# Patient Record
Sex: Male | Born: 1990 | Race: White | Hispanic: Yes | Marital: Married | Smoking: Former smoker
Health system: Southern US, Community
[De-identification: ages and names within clinical notes are randomized; demographics above are authoritative.]

## PROBLEM LIST (undated history)

## (undated) DIAGNOSIS — J45909 Unspecified asthma, uncomplicated: Secondary | ICD-10-CM

## (undated) HISTORY — DX: Unspecified asthma, uncomplicated: J45.909

## (undated) HISTORY — PX: SHOULDER SURGERY: SHX246

---
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
## 2014-09-17 ENCOUNTER — Ambulatory Visit: Payer: Medicaid Other

## 2014-11-13 ENCOUNTER — Ambulatory Visit (INDEPENDENT_AMBULATORY_CARE_PROVIDER_SITE_OTHER): Payer: Self-pay | Admitting: Internal Medicine

## 2014-11-13 VITALS — BP 112/68 | HR 58 | Temp 98.5°F | Resp 12 | Ht 67.5 in | Wt 149.5 lb

## 2014-11-13 DIAGNOSIS — Z202 Contact with and (suspected) exposure to infections with a predominantly sexual mode of transmission: Secondary | ICD-10-CM

## 2014-11-13 LAB — POCT UA - MICROSCOPIC ONLY
AMORPHOUS: POSITIVE
BACTERIA, U MICROSCOPIC: NEGATIVE
CASTS, UR, LPF, POC: NEGATIVE
Crystals, Ur, HPF, POC: NEGATIVE
Epithelial cells, urine per micros: NEGATIVE
Mucus, UA: NEGATIVE
RBC, URINE, MICROSCOPIC: NEGATIVE
Yeast, UA: NEGATIVE

## 2014-11-13 LAB — POCT URINALYSIS DIPSTICK
Bilirubin, UA: NEGATIVE
GLUCOSE UA: NEGATIVE
KETONES UA: NEGATIVE
LEUKOCYTES UA: NEGATIVE
Nitrite, UA: NEGATIVE
PH UA: 8
Protein, UA: NEGATIVE
RBC UA: NEGATIVE
SPEC GRAV UA: 1.02
UROBILINOGEN UA: 1

## 2014-11-13 MED ORDER — METRONIDAZOLE 500 MG PO TABS: 500.0000 mg | ORAL_TABLET | Freq: Two times a day (BID) | ORAL | Status: DC

## 2014-11-13 NOTE — Progress Notes (Signed)
   Subjective:    Patient ID: Steven EmeryJose Williamson, male    DOB: 03/01/1991, 24 y.o.   MRN: [id number]  HPI Scribed by Remi HaggardJulie Greer LPN with Dr. Perrin MalteseGuest. Room 2314  24 year old male complains of odor(fishy) from penis for one year since wife had iud in place Usually notice after sex with girlfriend and masturbation No discharge or rash. She has odor also, not all the time.    Review of Systems     Objective:   Physical Exam  Constitutional: He is oriented to person, place, and time. He appears well-developed and well-nourished. No distress.  HENT:  Head: Normocephalic.  Right Ear: External ear normal.  Left Ear: External ear normal.  Eyes: Conjunctivae and EOM are normal. Pupils are equal, round, and reactive to light.  Neck: Normal range of motion. Neck supple.  Cardiovascular: Normal rate and normal heart sounds.   Abdominal: Hernia confirmed negative in the right inguinal area and confirmed negative in the left inguinal area.  Genitourinary: Rectum normal, prostate normal, testes normal and penis normal. Prostate is not enlarged and not tender. Uncircumcised. No penile erythema or penile tenderness. No discharge found.  Normal exam  Musculoskeletal: Normal range of motion.  Lymphadenopathy:       Right: No inguinal adenopathy present.       Left: No inguinal adenopathy present.  Neurological: He is alert and oriented to person, place, and time.  Skin: No rash noted.  Psychiatric: He has a normal mood and affect. His behavior is normal. Thought content normal.          Assessment & Plan:  ODOR

## 2014-11-13 NOTE — Patient Instructions (Signed)
Trichomoniasis Trichomoniasis is an infection caused by an organism called Trichomonas. The infection can affect both women and men. In women, the outer male genitalia and the vagina are affected. In men, the penis is mainly affected, but the prostate and other reproductive organs can also be involved. Trichomoniasis is a sexually transmitted infection (STI) and is most often passed to another person through sexual contact.  RISK FACTORS  Having unprotected sexual intercourse.  Having sexual intercourse with an infected partner. SIGNS AND SYMPTOMS  Symptoms of trichomoniasis in women include:  Abnormal gray-green frothy vaginal discharge.  Itching and irritation of the vagina.  Itching and irritation of the area outside the vagina. Symptoms of trichomoniasis in men include:   Penile discharge with or without pain.  Pain during urination. This results from inflammation of the urethra. DIAGNOSIS  Trichomoniasis may be found during a Pap test or physical exam. Your health care provider may use one of the following methods to help diagnose this infection:  Examining vaginal discharge under a microscope. For men, urethral discharge would be examined.  Testing the pH of the vagina with a test tape.  Using a vaginal swab test that checks for the Trichomonas organism. A test is available that provides results within a few minutes.  Doing a culture test for the organism. This is not usually needed. TREATMENT   You may be given medicine to fight the infection. Women should inform their health care provider if they could be or are pregnant. Some medicines used to treat the infection should not be taken during pregnancy.  Your health care provider may recommend over-the-counter medicines or creams to decrease itching or irritation.  Your sexual partner will need to be treated if infected. HOME CARE INSTRUCTIONS   Take medicines only as directed by your health care provider.  Take  over-the-counter medicine for itching or irritation as directed by your health care provider.  Do not have sexual intercourse while you have the infection.  Women should not douche or wear tampons while they have the infection.  Discuss your infection with your partner. Your partner may have gotten the infection from you, or you may have gotten it from your partner.  Have your sex partner get examined and treated if necessary.  Practice safe, informed, and protected sex.  See your health care provider for other STI testing. SEEK MEDICAL CARE IF:   You still have symptoms after you finish your medicine.  You develop abdominal pain.  You have pain when you urinate.  You have bleeding after sexual intercourse.  You develop a rash.  Your medicine makes you sick or makes you throw up (vomit). MAKE SURE YOU:  Understand these instructions.  Will watch your condition.  Will get help right away if you are not doing well or get worse. Document Released: 12/05/2000 Document Revised: 10/26/2013 Document Reviewed: 03/23/2013 [employer] Patient Information 2015 [employer]. This information is not intended to replace advice given to you by your health care provider. Make sure you discuss any questions you have with your health care provider.  

## 2016-02-27 ENCOUNTER — Encounter (HOSPITAL_COMMUNITY): Payer: Self-pay | Admitting: Emergency Medicine

## 2016-02-27 DIAGNOSIS — F17210 Nicotine dependence, cigarettes, uncomplicated: Secondary | ICD-10-CM | POA: Diagnosis not present

## 2016-02-27 DIAGNOSIS — N4400 Torsion of testis, unspecified: Secondary | ICD-10-CM | POA: Diagnosis not present

## 2016-02-27 DIAGNOSIS — J45909 Unspecified asthma, uncomplicated: Secondary | ICD-10-CM | POA: Diagnosis not present

## 2016-02-27 DIAGNOSIS — N50811 Right testicular pain: Secondary | ICD-10-CM | POA: Diagnosis present

## 2016-02-27 LAB — CBC WITH DIFFERENTIAL/PLATELET
Basophils Absolute: 0 10*3/uL (ref 0.0–0.1)
Basophils Relative: 0 %
EOS ABS: 0.2 10*3/uL (ref 0.0–0.7)
EOS PCT: 2 %
HCT: 44.8 % (ref 39.0–52.0)
Hemoglobin: 15.6 g/dL (ref 13.0–17.0)
LYMPHS ABS: 3.7 10*3/uL (ref 0.7–4.0)
Lymphocytes Relative: 42 %
MCH: 28.9 pg (ref 26.0–34.0)
MCHC: 34.8 g/dL (ref 30.0–36.0)
MCV: 83.1 fL (ref 78.0–100.0)
Monocytes Absolute: 0.8 10*3/uL (ref 0.1–1.0)
Monocytes Relative: 10 %
Neutro Abs: 4 10*3/uL (ref 1.7–7.7)
Neutrophils Relative %: 46 %
PLATELETS: 143 10*3/uL — AB (ref 150–400)
RBC: 5.39 MIL/uL (ref 4.22–5.81)
RDW: 11.9 % (ref 11.5–15.5)
WBC: 8.8 10*3/uL (ref 4.0–10.5)

## 2016-02-27 LAB — COMPREHENSIVE METABOLIC PANEL
ALT: 24 U/L (ref 17–63)
ANION GAP: 5 (ref 5–15)
AST: 24 U/L (ref 15–41)
Albumin: 4.7 g/dL (ref 3.5–5.0)
Alkaline Phosphatase: 70 U/L (ref 38–126)
BUN: 18 mg/dL (ref 6–20)
CHLORIDE: 102 mmol/L (ref 101–111)
CO2: 26 mmol/L (ref 22–32)
CREATININE: 1 mg/dL (ref 0.61–1.24)
Calcium: 9.8 mg/dL (ref 8.9–10.3)
GFR calc non Af Amer: >60 mL/min (ref >60)
Glucose, Bld: 99 mg/dL (ref 65–99)
Potassium: 3.2 mmol/L — ABNORMAL LOW (ref 3.5–5.1)
SODIUM: 133 mmol/L — AB (ref 135–145)
Total Bilirubin: 0.6 mg/dL (ref 0.3–1.2)
Total Protein: 8.2 g/dL — ABNORMAL HIGH (ref 6.5–8.1)

## 2016-02-27 LAB — URINALYSIS, ROUTINE W REFLEX MICROSCOPIC
BILIRUBIN URINE: NEGATIVE
Glucose, UA: NEGATIVE mg/dL
HGB URINE DIPSTICK: NEGATIVE
KETONES UR: NEGATIVE mg/dL
Leukocytes, UA: NEGATIVE
Nitrite: NEGATIVE
PROTEIN: NEGATIVE mg/dL
SPECIFIC GRAVITY, URINE: 1.021 (ref 1.005–1.030)
pH: 7.5 (ref 5.0–8.0)

## 2016-02-27 NOTE — ED Triage Notes (Signed)
Pt. reports right testicular swelling / pressure onset this evening , denies fever or dysuria .

## 2016-02-28 ENCOUNTER — Emergency Department (HOSPITAL_COMMUNITY)
Admission: EM | Admit: 2016-02-28 | Discharge: 2016-02-28 | Disposition: A | Discharge status: Home / Self Care | Payer: Medicaid Other | Attending: Emergency Medicine | Admitting: Emergency Medicine

## 2016-02-28 ENCOUNTER — Emergency Department (HOSPITAL_COMMUNITY): Payer: Medicaid Other

## 2016-02-28 DIAGNOSIS — N4400 Torsion of testis, unspecified: Secondary | ICD-10-CM

## 2016-02-28 DIAGNOSIS — R52 Pain, unspecified: Secondary | ICD-10-CM

## 2016-02-28 MED ORDER — FENTANYL CITRATE (PF) 100 MCG/2ML IJ SOLN
50.0000 ug | Freq: Once | INTRAMUSCULAR | Status: AC
  Administered 2016-02-28: 50 ug via INTRAVENOUS
  Filled 2016-02-28: qty 2

## 2016-02-28 MED ORDER — HYDROCODONE-ACETAMINOPHEN 5-325 MG PO TABS: 1.0000 | ORAL_TABLET | Freq: Four times a day (QID) | ORAL | 0 refills | Status: AC | PRN

## 2016-02-28 MED ORDER — IBUPROFEN 800 MG PO TABS: 800.0000 mg | ORAL_TABLET | Freq: Three times a day (TID) | ORAL | 0 refills | Status: DC | PRN

## 2016-02-28 MED ORDER — SODIUM CHLORIDE 0.9 % IV SOLN
INTRAVENOUS | Status: DC
  Administered 2016-02-28: 01:00:00 via INTRAVENOUS

## 2016-02-28 MED ORDER — ONDANSETRON HCL 4 MG/2ML IJ SOLN
4.0000 mg | Freq: Once | INTRAMUSCULAR | Status: AC
  Administered 2016-02-28: 4 mg via INTRAVENOUS
  Filled 2016-02-28: qty 2

## 2016-02-28 NOTE — ED Notes (Signed)
Pt transported to US
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
## 2016-02-28 NOTE — Consult Note (Signed)
Consult: Testicular torsion Requested by: Dr. Elesa MassedWard  History of Present Illness: Steven Williamson is a 25 year old male who developed the sudden onset of right testicular pain about 10 PM. He came to the emergency department where a scrotal ultrasound was done which showed no flow to the right testicle although the testicle appeared normal without evidence of necrosis or edema. The left testicle was normal. Dr. Elesa MassedWard was able to detorsed the testicle and the patient had relief of the pain in the testicle seemed to be lying in the scrotum in normal fashion. A repeat ultrasound was done which I was able to view the end of it. This showed good flow to both testicles. The patient has never had pain like this before. 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  Past Medical History:  Diagnosis Date  . Asthma    Past Surgical History:  Procedure Laterality Date  . SHOULDER SURGERY      Home Medications:   (Not in a hospital admission) Allergies: No Known Allergies  No family history on file. Social History:  reports that he has been smoking Cigarettes.  He has never used smokeless tobacco. He reports that he drinks alcohol. He reports that he does not use drugs.  ROS: A complete review of systems was performed.  All systems are negative except for pertinent findings as noted. ROS   Physical Exam:  Vital signs in last 24 hours: Temp:  [98.8 F (37.1 C)] 98.8 F (37.1 C) (09/04 2325) Pulse Rate:  [75-79] 75 (09/05 0048) Resp:  [16] 16 (09/04 2325) BP: (141-149)/(80-92) 141/80 (09/05 0047) SpO2:  [99 %-100 %] 99 % (09/05 0048) Weight:  [73.5 kg (162 lb)] 73.5 kg (162 lb) (09/04 2326) Psych/General:  Alert and oriented, No acute distress HEENT: Normocephalic, atraumatic Lungs: Regular rate and effort Abdomen: Soft, nontender, nondistended, no abdominal masses Back: No CVA tenderness Extremities: No edema Neurologic: Grossly intact GU: The  penis is uncircumcised with a normal foreskin without phimosis. The scrotum appears normal. The testicles are palpably normal. The right testicle is palpably normal and lying in the scrotum in a normal fashion. The cord in the testicles were nontender. There are no palpable masses. The right epididymis is palpably normal and without tenderness. The left testicle and epididymis are palpably normal without mass or tenderness.  Laboratory Data:  Results for orders placed or performed during the hospital encounter of 02/28/16 (from the past 24 hour(s))  Urinalysis, Routine w reflex microscopic (not at [facility])     Status: None   Collection Time: 02/27/16 11:30 PM  Result Value Ref Range   Color, Urine YELLOW YELLOW   APPearance CLEAR CLEAR   Specific Gravity, Urine 1.021 1.005 - 1.030   pH 7.5 5.0 - 8.0   Glucose, UA NEGATIVE NEGATIVE mg/dL   Hgb urine dipstick NEGATIVE NEGATIVE   Bilirubin Urine NEGATIVE NEGATIVE   Ketones, ur NEGATIVE NEGATIVE mg/dL   Protein, ur NEGATIVE NEGATIVE mg/dL   Nitrite NEGATIVE NEGATIVE   Leukocytes, UA NEGATIVE NEGATIVE  CBC with Differential     Status: Abnormal   Collection Time: 02/27/16 11:30 PM  Result Value Ref Range   WBC 8.8 4.0 - 10.5 K/uL   RBC 5.39 4.22 - 5.81 MIL/uL   Hemoglobin 15.6 13.0 - 17.0 g/dL   HCT 96.044.8 45.439.0 - 09.852.0 %   MCV 83.1 78.0 - 100.0 fL   MCH 28.9 26.0 - 34.0 pg   MCHC 34.8  30.0 - 36.0 g/dL   RDW 16.1 09.6 - 04.5 %   Platelets 143 (L) 150 - 400 K/uL   Neutrophils Relative % 46 %   Neutro Abs 4.0 1.7 - 7.7 K/uL   Lymphocytes Relative 42 %   Lymphs Abs 3.7 0.7 - 4.0 K/uL   Monocytes Relative 10 %   Monocytes Absolute 0.8 0.1 - 1.0 K/uL   Eosinophils Relative 2 %   Eosinophils Absolute 0.2 0.0 - 0.7 K/uL   Basophils Relative 0 %   Basophils Absolute 0.0 0.0 - 0.1 K/uL  Comprehensive metabolic panel     Status: Abnormal   Collection Time: 02/27/16 11:30 PM  Result Value Ref Range   Sodium 133 (L) 135 - 145 mmol/L   Potassium  3.2 (L) 3.5 - 5.1 mmol/L   Chloride 102 101 - 111 mmol/L   CO2 26 22 - 32 mmol/L   Glucose, Bld 99 65 - 99 mg/dL   BUN 18 6 - 20 mg/dL   Creatinine, Ser 4.09 0.61 - 1.24 mg/dL   Calcium 9.8 8.9 - 81.1 mg/dL   Total Protein 8.2 (H) 6.5 - 8.1 g/dL   Albumin 4.7 3.5 - 5.0 g/dL   AST 24 15 - 41 U/L   ALT 24 17 - 63 U/L   Alkaline Phosphatase 70 38 - 126 U/L   Total Bilirubin 0.6 0.3 - 1.2 mg/dL   GFR calc non Af Amer >60 >60 mL/min   GFR calc Af Amer >60 >60 mL/min   Anion gap 5 5 - 15   No results found for this or any previous visit (from the past 240 hour(s)). Creatinine:  Recent Labs  02/27/16 2330  CREATININE 1.00    Impression/Assessment/Plan: Torsion episode-I discussed with the patient the nature of testicular torsion which can lead to loss of testicle. We discussed the nature, risk and benefits of urgent orchiopexy tonight or consideration of elective orchiopexy in the future. Patient elects continued surveillance with potential elective orchiopexy at a future date. 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 We discussed warning signs and to return to emergency department immediately should he develop pain again. All questions answered. I'll plan to see him back in the next couple of weeks and I gave him my contact information. Also discussed return precautions with his wife.  Steven Williamson 02/28/2016, 2:26 AM

## 2017-05-05 IMAGING — US US SCROTUM
1 series · 13 of 25 positions shown · non-contrast
Comparison: None.

CLINICAL DATA: 25-year-old male with right testicular swelling and
tenderness.

EXAM:
SCROTAL ULTRASOUND
DOPPLER ULTRASOUND OF THE TESTICLES
TECHNIQUE: Complete ultrasound examination of the testicles, epididymis, and
other scrotal structures was performed. Color and spectral Doppler
ultrasound were also utilized to evaluate blood flow to the
testicles.

[Series 1: us scrotum · 0.06mm/px · 13 of 47 slices shown]
[im 1/47]
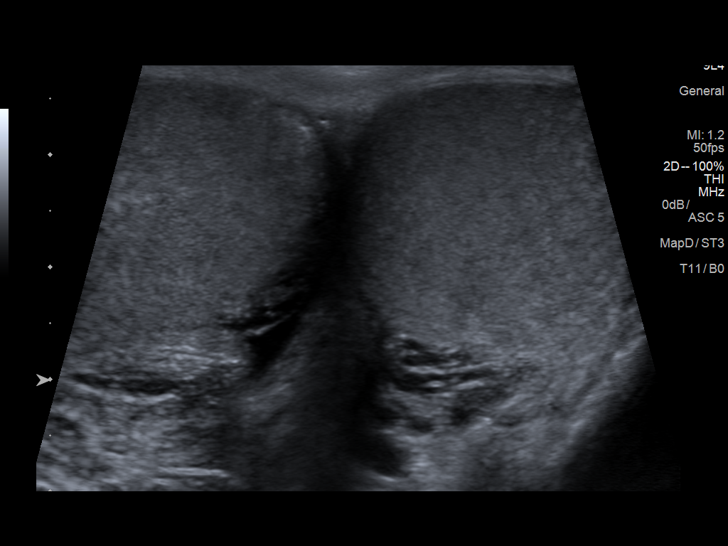
[im 4/47]
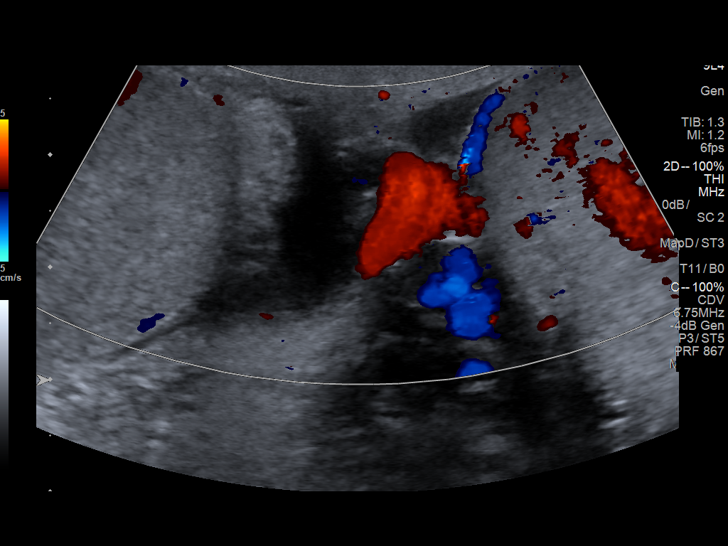
[im 8/47]
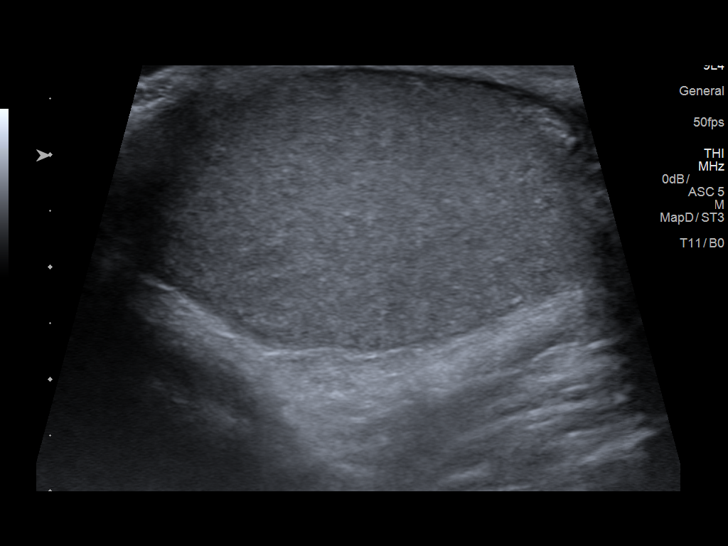
[im 12/47]
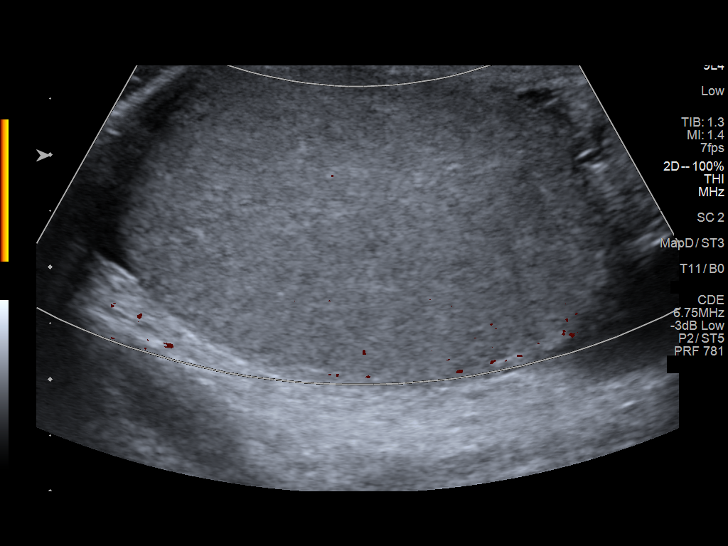
[im 16/47]
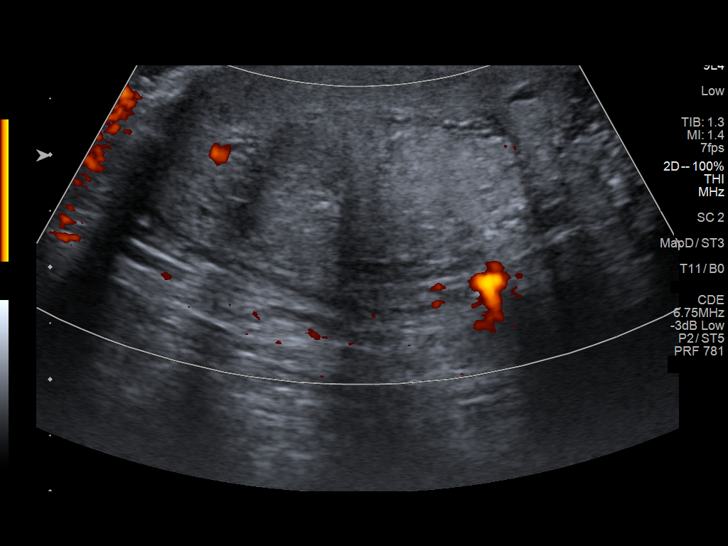
[im 20/47]
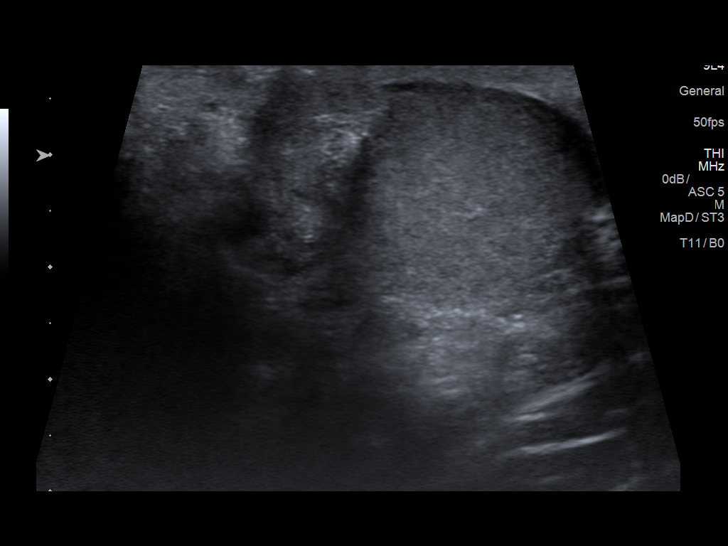
[im 24/47]
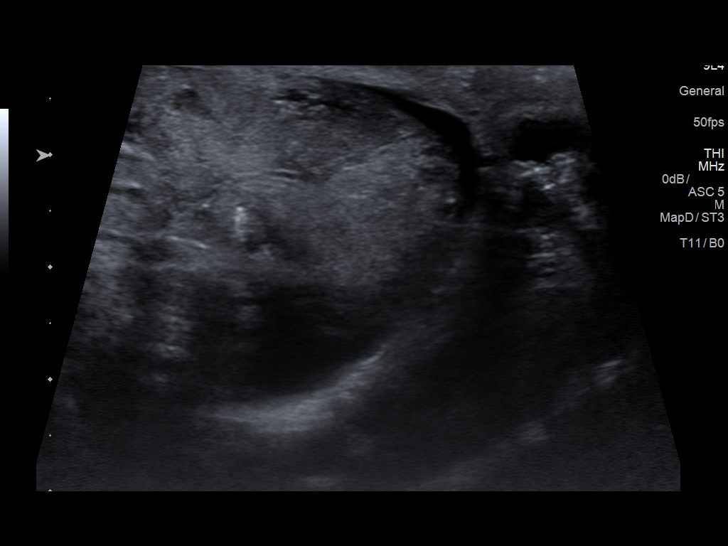
[im 27/47]
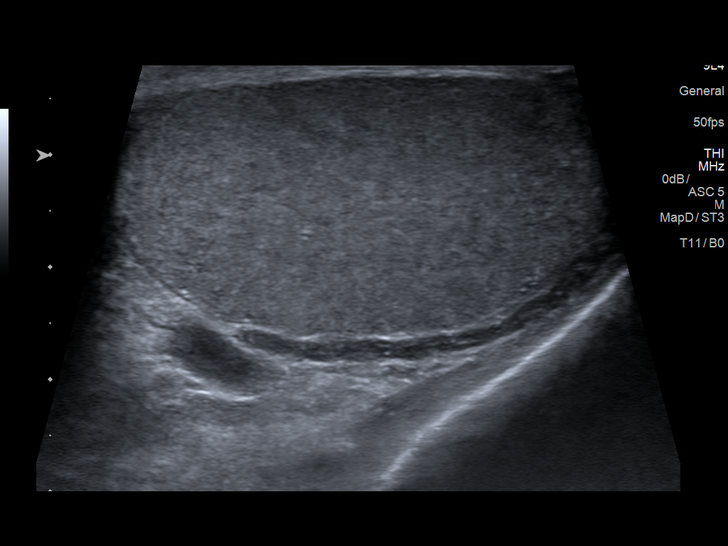
[im 31/47]
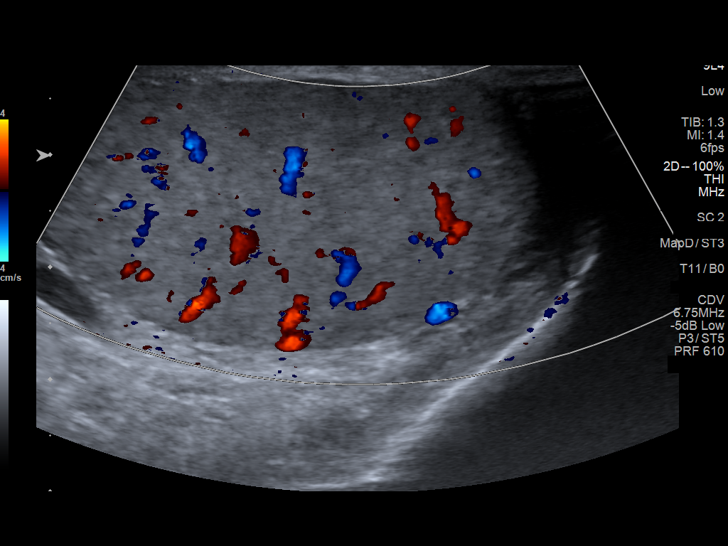
[im 35/47]
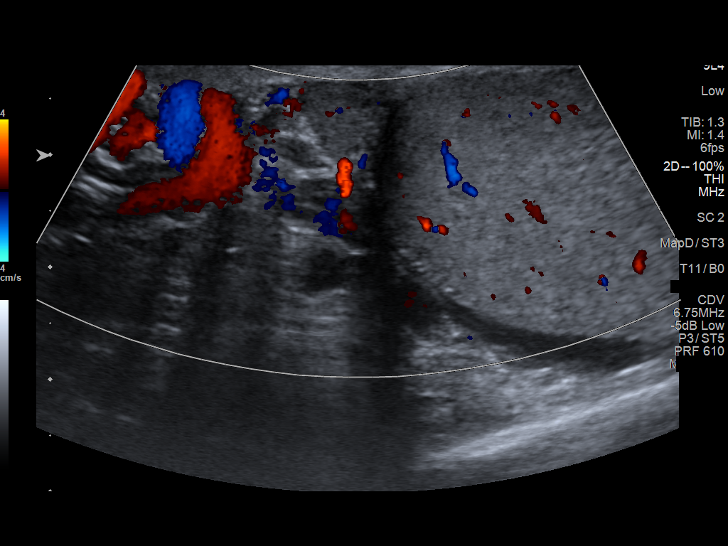
[im 39/47]
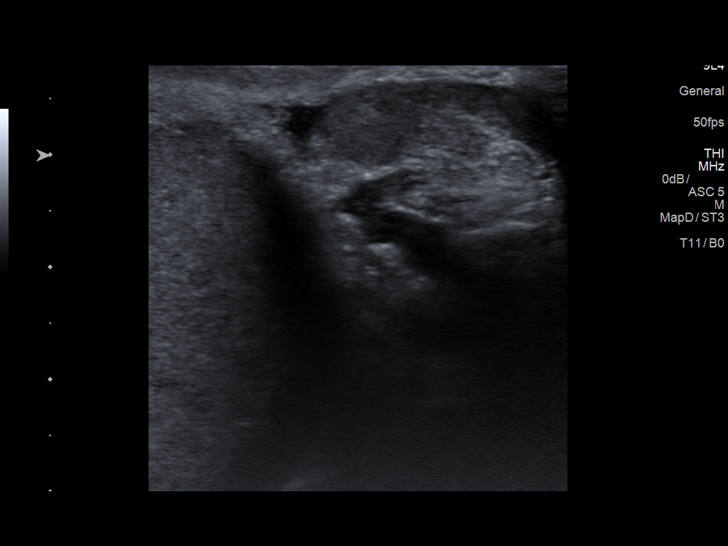
[im 43/47]
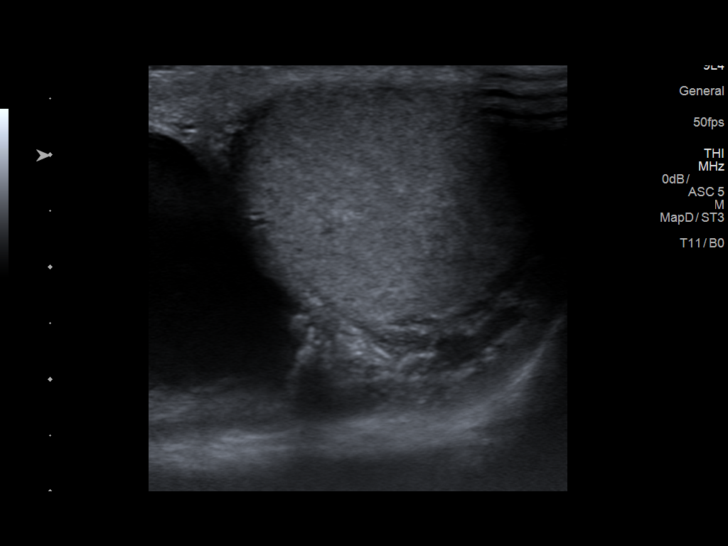
[im 47/47]
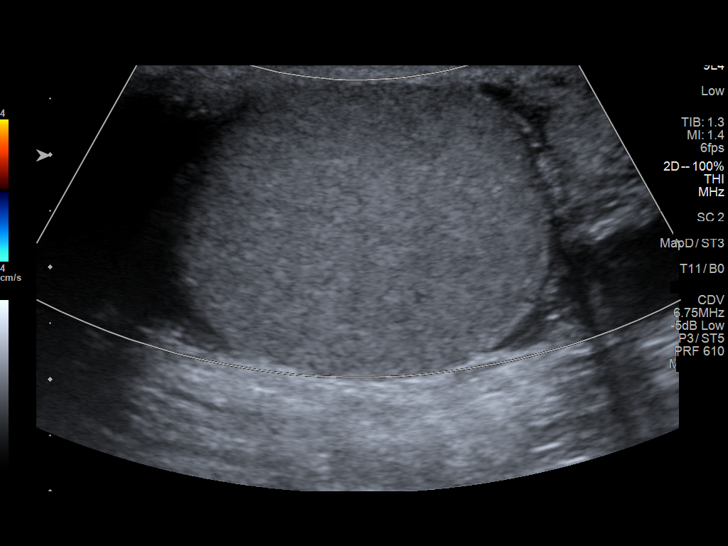

[13 of 25 positions shown; findings below may reference images not displayed]

FINDINGS: Right testicle

Measurements: 4.3 x 2.9 x 3.3 cm. No mass or microlithiasis
visualized.

Left testicle

Measurements: 4.3 x 2.4 x 2.8 cm. No mass or microlithiasis
visualized.

Right epididymis: Mildly enlarged. No flow identified in the right
epididymis.

Left epididymis:  Normal in size and appearance.

Hydrocele: There is a small right hydrocele. No hydrocele identified
on the left.

Varicocele:  Left-sided varicoceles noted.

Pulsed Doppler interrogation of both testes demonstrates normal low
resistance arterial and venous waveforms to the left testicle. There
is absence of arterial and venous flow to to the right testicle and
right epididymis.
IMPRESSION: Absent flow to the right testicle and epididymis most compatible
with torsion. Clinical correlation and surgical consult is advised.

These results were called by telephone at the time of interpretation
on 02/28/2016 at [DATE] to Dr. KEMAR MEGAN , who verbally
acknowledged these results.

## 2019-04-17 ENCOUNTER — Ambulatory Visit (HOSPITAL_COMMUNITY)
Admission: EM | Admit: 2019-04-17 | Discharge: 2019-04-17 | Disposition: A | Discharge status: Home / Self Care | Payer: Self-pay | Attending: Urgent Care | Admitting: Urgent Care

## 2019-04-17 ENCOUNTER — Encounter (HOSPITAL_COMMUNITY): Payer: Self-pay

## 2019-04-17 ENCOUNTER — Other Ambulatory Visit: Payer: Self-pay

## 2019-04-17 DIAGNOSIS — R05 Cough: Secondary | ICD-10-CM

## 2019-04-17 DIAGNOSIS — Z87891 Personal history of nicotine dependence: Secondary | ICD-10-CM

## 2019-04-17 DIAGNOSIS — S0502XA Injury of conjunctiva and corneal abrasion without foreign body, left eye, initial encounter: Secondary | ICD-10-CM

## 2019-04-17 DIAGNOSIS — R059 Cough, unspecified: Secondary | ICD-10-CM

## 2019-04-17 MED ORDER — PROMETHAZINE-DM 6.25-15 MG/5ML PO SYRP: 5.0000 mL | ORAL_SOLUTION | Freq: Every evening | ORAL | 0 refills | Status: AC | PRN

## 2019-04-17 MED ORDER — TOBRAMYCIN 0.3 % OP OINT: 1.0000 "application " | TOPICAL_OINTMENT | Freq: Four times a day (QID) | OPHTHALMIC | 0 refills | Status: AC

## 2019-04-17 MED ORDER — CETIRIZINE HCL 10 MG PO TABS: 10.0000 mg | ORAL_TABLET | Freq: Every day | ORAL | 0 refills | Status: AC

## 2019-04-17 MED ORDER — BENZONATATE 100 MG PO CAPS
100.0000 mg | ORAL_CAPSULE | Freq: Three times a day (TID) | ORAL | 0 refills | Status: AC | PRN
Start: 2019-04-17 — End: ?

## 2019-04-17 MED ORDER — MONTELUKAST SODIUM 10 MG PO TABS: 10.0000 mg | ORAL_TABLET | Freq: Every day | ORAL | 0 refills | Status: AC

## 2019-04-17 MED ORDER — FLUORESCEIN SODIUM 1 MG OP STRP
ORAL_STRIP | OPHTHALMIC | Status: AC
  Filled 2019-04-17: qty 1

## 2019-04-17 MED ORDER — TETRACAINE HCL 0.5 % OP SOLN
OPHTHALMIC | Status: AC
  Filled 2019-04-17: qty 4

## 2019-04-17 NOTE — ED Triage Notes (Signed)
Pt also added in that he has an ongoing non productive cough that mostly is aroused with drinking liquids
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
## 2019-04-17 NOTE — ED Provider Notes (Signed)
MRN: [id number] DOB: 1990-09-08  Subjective:   Steven Williamson is a 28 y.o. male presenting for 2 complaints.  Reports that his baby accidentally poked him in the left eye last night and had subsequent pain that progressed into this morning.  He has had a difficult time will opening up his eye but has progressed well throughout the day.  He also has a 1 month history of a cough.  He has already been seen for this about 3 weeks ago, tested negative for Covid and was recommended supportive care.  He quit smoking about 2 weeks ago.  Has been traveling extensively for his work.  Has a history of allergies and was given albuterol inhaler which she has used with good relief but is worried that his cough is still there.   No current facility-administered medications for this encounter.   Current Outpatient Medications:  .  HYDROcodone-acetaminophen (NORCO/VICODIN) 5-325 MG tablet, Take 1-2 tablets by mouth every 6 (six) hours as needed for moderate pain or severe pain., Disp: 10 tablet, Rfl: 0 .  ibuprofen (ADVIL,MOTRIN) 800 MG tablet, Take 1 tablet (800 mg total) by mouth every 8 (eight) hours as needed for mild pain., Disp: 30 tablet, Rfl: 0    No Known Allergies   Past Medical History:  Diagnosis Date  . Asthma      Past Surgical History:  Procedure Laterality Date  . SHOULDER SURGERY      Review of Systems  Constitutional: Negative for fever and malaise/fatigue.  HENT: Negative for congestion, ear pain, sinus pain and sore throat.   Eyes: Positive for blurred vision, pain and redness. Negative for discharge.  Respiratory: Positive for cough. Negative for hemoptysis, shortness of breath and wheezing.   Cardiovascular: Negative for chest pain.  Gastrointestinal: Negative for abdominal pain, diarrhea, nausea and vomiting.  Genitourinary: Negative for dysuria, flank pain and hematuria.  Musculoskeletal: Negative for myalgias.  Skin: Negative for rash.  Neurological: Negative for  dizziness, weakness and headaches.  Psychiatric/Behavioral: Negative for depression and substance abuse.    Objective:   Vitals: BP 135/74 (BP Location: Left Arm)   Pulse 73   Temp 98.3 F (36.8 C) (Oral)   Resp 18   SpO2 98%   Physical Exam Constitutional:      General: He is not in acute distress.    Appearance: Normal appearance. He is well-developed. He is not ill-appearing, toxic-appearing or diaphoretic.  HENT:     Head: Normocephalic and atraumatic.     Right Ear: External ear normal.     Left Ear: External ear normal.     Nose: Nose normal.     Mouth/Throat:     Mouth: Mucous membranes are moist.     Pharynx: Oropharynx is clear.  Eyes:     General: No scleral icterus.    Extraocular Movements: Extraocular movements intact.     Pupils: Pupils are equal, round, and reactive to light.   Cardiovascular:     Rate and Rhythm: Normal rate and regular rhythm.     Heart sounds: Normal heart sounds. No murmur. No friction rub. No gallop.   Pulmonary:     Effort: Pulmonary effort is normal. No respiratory distress.     Breath sounds: Normal breath sounds. No stridor. No wheezing, rhonchi or rales.  Neurological:     Mental Status: He is alert and oriented to person, place, and time.  Psychiatric:        Mood and Affect: Mood normal.  Behavior: Behavior normal.        Thought Content: Thought content normal.    Eye Exam: Eyelids everted and swept for foreign body. The eye was stained with fluorescein. Examination under woods lamp does reveal an area of increased stain uptake as depicted. The eye was then irrigated copiously with saline.   Assessment and Plan :   1. Abrasion of left cornea, initial encounter   2. Cough   3. Former smoker     We will have patient start tobramycin, recommended follow-up with Dr. Conley Rolls.  Patient refused COVID-19 testing and was reluctant to have follow-up with an ophthalmologist due to cost burden and not having insurance.  Declined  chest x-ray for the same reason.  Offered supportive care. Counseled patient on potential for adverse effects with medications prescribed/recommended today, ER and return-to-clinic precautions discussed, patient verbalized understanding.    Wallis Bamberg, PA-C 04/17/19 1420

## 2020-03-24 ENCOUNTER — Ambulatory Visit: Payer: Self-pay | Attending: Internal Medicine

## 2020-03-24 DIAGNOSIS — Z23 Encounter for immunization: Secondary | ICD-10-CM

## 2020-03-24 NOTE — Progress Notes (Signed)
   Covid-19 Vaccination Clinic  Name:  Steven Williamson    MRN: [id number] DOB: [birth year]  03/24/2020  Mr. Steven Williamson was observed post Covid-19 immunization for 15 minutes without incident. He was provided with Vaccine Information Sheet and instruction to access the V-Safe system.   Mr. Steven Williamson was instructed to call 911 with any severe reactions post vaccine: Marland Kitchen Difficulty breathing  . Swelling of face and throat  . A fast heartbeat  . A bad rash all over body  . Dizziness and weakness   Immunizations Administered    Name Date Dose VIS Date Route   JANSSEN COVID-19 VACCINE 03/24/2020  3:40 PM 0.5 mL 08/22/2019 Intramuscular   Manufacturer: [employer]   Lot: [id number]   NDC: 79024-097-35

## 2021-05-04 ENCOUNTER — Encounter (HOSPITAL_COMMUNITY): Payer: Self-pay | Admitting: Emergency Medicine

## 2021-05-04 ENCOUNTER — Other Ambulatory Visit: Payer: Self-pay

## 2021-05-04 ENCOUNTER — Ambulatory Visit (HOSPITAL_COMMUNITY)
Admission: EM | Admit: 2021-05-04 | Discharge: 2021-05-04 | Disposition: A | Discharge status: Home / Self Care | Payer: Self-pay | Attending: Family Medicine | Admitting: Family Medicine

## 2021-05-04 DIAGNOSIS — Z8709 Personal history of other diseases of the respiratory system: Secondary | ICD-10-CM

## 2021-05-04 DIAGNOSIS — F419 Anxiety disorder, unspecified: Secondary | ICD-10-CM

## 2021-05-04 MED ORDER — ALBUTEROL SULFATE HFA 108 (90 BASE) MCG/ACT IN AERS
1.0000 | INHALATION_SPRAY | Freq: Four times a day (QID) | RESPIRATORY_TRACT | 0 refills | Status: AC | PRN
Start: 2021-05-04 — End: ?

## 2021-05-04 MED ORDER — HYDROXYZINE HCL 25 MG PO TABS: 25.0000 mg | ORAL_TABLET | Freq: Three times a day (TID) | ORAL | 0 refills | Status: AC | PRN

## 2021-05-04 NOTE — ED Triage Notes (Signed)
Pt is present today feel anxious, SOB, and stressed. Pt states he started having these feelings two months ago.

## 2021-05-04 NOTE — ED Provider Notes (Signed)
[facility]    CSN: [id number] Arrival date & time: 05/04/21  1114      History   Chief Complaint Chief Complaint  Patient presents with   Anxiety    HPI Steven Williamson is a 30 y.o. male.   Presenting today with 31-month history of persistent intermittent feelings of anxiousness, intermittent panic attacks particularly when traveling for work.  States his chest will feel tight and he will have difficulty sleeping, racing thoughts.  Prior to the past 2 months has never had an issue like this in the past.  He states it is very stressful at work right now, particular with being away from home so much.  He denies chest pain, dizziness, syncope, recent illness.  Has been taking some calming Gummies that he found at the grocery store and states this does help somewhat as does warm tea in the evenings.  1 day while he was traveling in [place] for work he had what he believes to be a panic attack and went to the emergency department.  Had a full work-up including blood work and cardiac testing which was benign and he was told that it was stress related.   Past Medical History:  Diagnosis Date   Asthma     There are no problems to display for this patient.   Past Surgical History:  Procedure Laterality Date   SHOULDER SURGERY       Home Medications    Prior to Admission medications   Medication Sig Start Date End Date Taking? Authorizing Provider  albuterol (VENTOLIN HFA) 108 (90 Base) MCG/ACT inhaler Inhale 1-2 puffs into the lungs every 6 (six) hours as needed for wheezing or shortness of breath. 05/04/21  Yes Particia Nearing, PA-C  hydrOXYzine (ATARAX/VISTARIL) 25 MG tablet Take 1 tablet (25 mg total) by mouth every 8 (eight) hours as needed. May cause drowsiness 05/04/21  Yes Particia Nearing, PA-C  benzonatate (TESSALON) 100 MG capsule Take 1-2 capsules (100-200 mg total) by mouth 3 (three) times daily as needed. 04/17/19   Wallis Bamberg, PA-C   cetirizine (ZYRTEC ALLERGY) 10 MG tablet Take 1 tablet (10 mg total) by mouth daily. 04/17/19   Wallis Bamberg, PA-C  HYDROcodone-acetaminophen (NORCO/VICODIN) 5-325 MG tablet Take 1-2 tablets by mouth every 6 (six) hours as needed for moderate pain or severe pain. 02/28/16   Ward, Layla Maw, DO  ibuprofen (ADVIL,MOTRIN) 800 MG tablet Take 1 tablet (800 mg total) by mouth every 8 (eight) hours as needed for mild pain. 02/28/16   Ward, Layla Maw, DO  montelukast (SINGULAIR) 10 MG tablet Take 1 tablet (10 mg total) by mouth at bedtime. 04/17/19   Wallis Bamberg, PA-C  promethazine-dextromethorphan (PROMETHAZINE-DM) 6.25-15 MG/5ML syrup Take 5 mLs by mouth at bedtime as needed for cough. 04/17/19   Wallis Bamberg, PA-C  tobramycin (TOBREX) 0.3 % ophthalmic ointment Place 1 application into the left eye 4 (four) times daily. 04/17/19   Wallis Bamberg, PA-C    Family History No family history on file.  Social History Social History   Tobacco Use   Smoking status: Former    Types: Cigarettes    Quit date: 07/06/2019    Years since quitting: 1.8   Smokeless tobacco: Never  Vaping Use   Vaping Use: Former  Substance Use Topics   Alcohol use: Yes    Alcohol/week: 0.0 standard drinks   Drug use: No     Allergies   Patient has no known allergies.   Review  of Systems Review of Systems Per HPI  Physical Exam Triage Vital Signs ED Triage Vitals  Enc Vitals Group     BP 05/04/21 1208 (!) 152/85     Pulse Rate 05/04/21 1208 65     Resp 05/04/21 1208 19     Temp 05/04/21 1208 98.5 F (36.9 C)     Temp src --      SpO2 05/04/21 1208 98 %     Weight --      Height --      Head Circumference --      Peak Flow --      Pain Score 05/04/21 1205 0     Pain Loc --      Pain Edu? --      Excl. in GC? --    No data found.  Updated Vital Signs BP (!) 152/85   Pulse 65   Temp 98.5 F (36.9 C)   Resp 19   SpO2 98%   Visual Acuity Right Eye Distance:   Left Eye Distance:   Bilateral  Distance:    Right Eye Near:   Left Eye Near:    Bilateral Near:     Physical Exam Vitals and nursing note reviewed.  Constitutional:      Appearance: Normal appearance.  HENT:     Head: Atraumatic.     Mouth/Throat:     Mouth: Mucous membranes are moist.     Pharynx: Oropharynx is clear.  Eyes:     Extraocular Movements: Extraocular movements intact.     Conjunctiva/sclera: Conjunctivae normal.  Cardiovascular:     Rate and Rhythm: Normal rate and regular rhythm.     Heart sounds: Normal heart sounds.  Pulmonary:     Effort: Pulmonary effort is normal. No respiratory distress.     Breath sounds: Normal breath sounds. No wheezing or rales.  Musculoskeletal:        General: Normal range of motion.     Cervical back: Normal range of motion and neck supple.  Skin:    General: Skin is warm and dry.  Neurological:     General: No focal deficit present.     Mental Status: He is oriented to person, place, and time.  Psychiatric:        Mood and Affect: Mood normal.        Thought Content: Thought content normal.        Judgment: Judgment normal.     UC Treatments / Results  Labs (all labs ordered are listed, but only abnormal results are displayed) Labs Reviewed - No data to display  EKG   Radiology No results found.  Procedures Procedures (including critical care time)  Medications Ordered in UC Medications - No data to display  Initial Impression / Assessment and Plan / UC Course  I have reviewed the triage vital signs and the nursing notes.  Pertinent labs & imaging results that were available during my care of the patient were reviewed by me and considered in my medical decision making (see chart for details).     Vital signs and exam reassuring today without obvious abnormality.  Declines labs, EKG and other work-up today as he just had that in the emergency department and it was all normal.  He does have a history in adolescence of asthma that occurred  seasonally so we will provide an albuterol inhaler in case this is playing any role.  We will also give hydroxyzine for as needed use for  sleep, panic attacks.  Gave resources for the behavioral health walk-in clinic in case needed while awaiting to get in with his primary care provider for further evaluation and management.  Return for acutely worsening symptoms at any time.  Final Clinical Impressions(s) / UC Diagnoses   Final diagnoses:  Anxiety  History of asthma     Discharge Instructions      [facility] Address: [address] [place], [place] [address] Phone: [phone]       ED Prescriptions     Medication Sig Dispense Auth. Provider   albuterol (VENTOLIN HFA) 108 (90 Base) MCG/ACT inhaler Inhale 1-2 puffs into the lungs every 6 (six) hours as needed for wheezing or shortness of breath. 18 g Particia Nearing, [place]   hydrOXYzine (ATARAX/VISTARIL) 25 MG tablet Take 1 tablet (25 mg total) by mouth every 8 (eight) hours as needed. May cause drowsiness 20 tablet Particia Nearing, [place]      PDMP not reviewed this encounter.   Particia Nearing, [place] 05/04/21 1302

## 2021-05-04 NOTE — Discharge Instructions (Signed)
[facility] °Address: [address], [place], [place] [address] °Phone: [phone] °

## 2021-09-24 ENCOUNTER — Encounter (HOSPITAL_COMMUNITY): Payer: Self-pay | Admitting: *Deleted

## 2021-09-24 ENCOUNTER — Other Ambulatory Visit: Payer: Self-pay

## 2021-09-24 ENCOUNTER — Ambulatory Visit (HOSPITAL_COMMUNITY)
Admission: EM | Admit: 2021-09-24 | Discharge: 2021-09-24 | Disposition: A | Discharge status: Home / Self Care | Payer: Self-pay | Attending: Nurse Practitioner | Admitting: Nurse Practitioner

## 2021-09-24 DIAGNOSIS — R519 Headache, unspecified: Secondary | ICD-10-CM

## 2021-09-24 MED ORDER — IBUPROFEN 800 MG PO TABS: 800.0000 mg | ORAL_TABLET | Freq: Three times a day (TID) | ORAL | 0 refills | Status: AC | PRN

## 2021-09-24 MED ORDER — KETOROLAC TROMETHAMINE 60 MG/2ML IM SOLN
60.0000 mg | Freq: Once | INTRAMUSCULAR | Status: AC
  Administered 2021-09-24: 60 mg via INTRAMUSCULAR

## 2021-09-24 NOTE — ED Provider Notes (Signed)
?[facility] ? ? ? ?CSN: [id number] ?Arrival date & time: 09/24/21  1432 ? ? ?  ? ?History   ?Chief Complaint ?Chief Complaint  ?Patient presents with  ? Facial Pain  ? Headache  ? ? ?HPI ?Avontae Burkhead is a 31 y.o. male.  ? ?The patient is a 31 year old male who presents with headache.  Patient states symptoms started 1 day ago.  He states that he thought this was related to his sinuses.  He went to the local pharmacy and bought some cough expectorant.  He states he took that and began coughing up more, which he felt helped relieve his headache.  He has also been taking allergy medicine.  He does not feel that he has had much relief.  The patient denies fever, chills, light sensitivity sound sensitivity, blurred vision, change in vision, nausea, vomiting, or diarrhea.  The patient states he he drank a little bit the night prior, states that he has not been drinking a lot today as far as water intake.  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 ? ? ?Headache ?Pain location:  Occipital ?Radiates to:  Does not radiate ?Progression:  Waxing and waning ?Context: coughing and emotional stress   ?Context: not exposure to bright light and not eating   ?Associated symptoms: congestion and cough   ?Associated symptoms: no abdominal pain, no blurred vision, no diarrhea, no dizziness, no ear pain, no eye pain, no facial pain, no nausea, no neck pain, no neck stiffness, no numbness, no paresthesias, no photophobia, no sinus pressure, no sore throat, no tingling and no visual change   ? ?Past Medical History:  ?Diagnosis Date  ? Asthma   ? ? ?There are no problems to display for this patient. ? ? ?Past Surgical History:  ?Procedure Laterality Date  ? SHOULDER SURGERY    ? ? ? ? ? ?Home Medications   ? ?Prior to Admission medications   ?Medication Sig Start Date End Date Taking? Authorizing Provider  ?ibuprofen (ADVIL) 800 MG tablet Take 1 tablet (800  mg total) by mouth every 8 (eight) hours as needed for up to 10 days for moderate pain. 09/24/21 10/04/21 Yes Nolberto Cheuvront-Warren, Sadie Haber, NP  ?albuterol (VENTOLIN HFA) 108 (90 Base) MCG/ACT inhaler Inhale 1-2 puffs into the lungs every 6 (six) hours as needed for wheezing or shortness of breath. 05/04/21   Particia Nearing, PA-C  ?benzonatate (TESSALON) 100 MG capsule Take 1-2 capsules (100-200 mg total) by mouth 3 (three) times daily as needed. 04/17/19   Wallis Bamberg, PA-C  ?cetirizine (ZYRTEC ALLERGY) 10 MG tablet Take 1 tablet (10 mg total) by mouth daily. 04/17/19   Wallis Bamberg, PA-C  ?HYDROcodone-acetaminophen (NORCO/VICODIN) 5-325 MG tablet Take 1-2 tablets by mouth every 6 (six) hours as needed for moderate pain or severe pain. 02/28/16   Ward, Layla Maw, DO  ?hydrOXYzine (ATARAX/VISTARIL) 25 MG tablet Take 1 tablet (25 mg total) by mouth every 8 (eight) hours as needed. May cause drowsiness 05/04/21   Particia Nearing, PA-C  ?montelukast (SINGULAIR) 10 MG tablet Take 1 tablet (10 mg total) by mouth at bedtime. 04/17/19   Wallis Bamberg, PA-C  ?promethazine-dextromethorphan (PROMETHAZINE-DM) 6.25-15 MG/5ML syrup Take 5 mLs by mouth at bedtime as needed for cough. 04/17/19   Wallis Bamberg, PA-C  ?tobramycin (TOBREX) 0.3 % ophthalmic ointment Place 1 application into the left eye 4 (four) times daily. 04/17/19   Urban Gibson,  Marquita PalmsMario, PA-C  ? ? ?Family History ?History reviewed. No pertinent family history. ? ?Social History ?Social History  ? ?Tobacco Use  ? Smoking status: Former  ?  Types: Cigarettes  ?  Quit date: 07/06/2019  ?  Years since quitting: 2.2  ? Smokeless tobacco: Never  ?Vaping Use  ? Vaping Use: Former  ?Substance Use Topics  ? Alcohol use: Yes  ?  Alcohol/week: 0.0 standard drinks  ? Drug use: No  ? ? ? ?Allergies   ?Patient has no known allergies. ? ? ?Review of Systems ?Review of Systems  ?HENT:  Positive for congestion. Negative for ear pain, sinus pressure and sore throat.   ?Eyes:  Negative for  blurred vision, photophobia and pain.  ?Respiratory:  Positive for cough.   ?Gastrointestinal:  Negative for abdominal pain, diarrhea and nausea.  ?Musculoskeletal:  Negative for neck pain and neck stiffness.  ?Neurological:  Positive for headaches. Negative for dizziness, numbness and paresthesias.  ? ? ?Physical Exam ?Triage Vital Signs ?ED Triage Vitals  ?Enc Vitals Group  ?   BP 09/24/21 1527 139/90  ?   Pulse Rate 09/24/21 1527 86  ?   Resp 09/24/21 1527 16  ?   Temp 09/24/21 1527 98.6 ?F (37 ?C)  ?   Temp src --   ?   SpO2 09/24/21 1527 98 %  ?   Weight --   ?   Height --   ?   Head Circumference --   ?   Peak Flow --   ?   Pain Score 09/24/21 1523 7  ?   Pain Loc --   ?   Pain Edu? --   ?   Excl. in GC? --   ? ?No data found. ? ?Updated Vital Signs ?BP 139/90   Pulse 86   Temp 98.6 ?F (37 ?C)   Resp 16   SpO2 98%  ? ?Visual Acuity ?Right Eye Distance:   ?Left Eye Distance:   ?Bilateral Distance:   ? ?Right Eye Near:   ?Left Eye Near:    ?Bilateral Near:    ? ?Physical Exam ?Vitals reviewed.  ?Constitutional:   ?   General: He is not in acute distress. ?   Appearance: He is well-developed.  ?HENT:  ?   Head: Normocephalic.  ?   Mouth/Throat:  ?   Mouth: Mucous membranes are moist.  ?Eyes:  ?   General: No visual field deficit. ?   Extraocular Movements: Extraocular movements intact.  ?   Pupils: Pupils are equal, round, and reactive to light.  ?Cardiovascular:  ?   Rate and Rhythm: Normal rate and regular rhythm.  ?   Heart sounds: Normal heart sounds.  ?Pulmonary:  ?   Effort: Pulmonary effort is normal.  ?   Breath sounds: Normal breath sounds.  ?Abdominal:  ?   General: Bowel sounds are normal.  ?   Palpations: Abdomen is soft.  ?   Tenderness: There is no abdominal tenderness.  ?Musculoskeletal:  ?   Cervical back: Normal range of motion and neck supple.  ?Skin: ?   General: Skin is warm and dry.  ?Neurological:  ?   Mental Status: He is alert.  ?   GCS: GCS eye subscore is 4. GCS verbal subscore is 5.  GCS motor subscore is 6.  ?   Cranial Nerves: No cranial nerve deficit or facial asymmetry.  ?   Motor: No weakness.  ?   Coordination: Coordination normal.  ?  Gait: Gait normal.  ?Psychiatric:     ?   Speech: Speech normal.     ?   Behavior: Behavior normal.  ? ? ? ?UC Treatments / Results  ?Labs ?(all labs ordered are listed, but only abnormal results are displayed) ?Labs Reviewed - No data to display ? ?EKG ? ? ?Radiology ?No results found. ? ?Procedures ?Procedures (including critical care time) ? ?Medications Ordered in UC ?Medications  ?ketorolac (TORADOL) injection 60 mg (has no administration in time range)  ? ? ?Initial Impression / Assessment and Plan / UC Course  ?I have reviewed the triage vital signs and the nursing notes. ? ?Pertinent labs & imaging results that were available during my care of the patient were reviewed by me and considered in my medical decision making (see chart for details). ? ?The patient is a 31 year old male who presents with headache.  Symptoms started 1 day ago.  Patient thought his symptoms were related to his sinuses.  He went and purchased an occasion for nasal congestion, and has been taking his allergy medicine.  Headache is located in the occipital region of his head.  Neurological exam was normal, with no deficits.  Vital signs are stable, no other acute findings on exam.  Patient does note that he has an increased amount of stress due to his livelihood.  Symptoms are most likely related to his stress, cannot rule out a tension headache.  Toradol 60 mg was given for pain.  Ibuprofen was also prescribed.  Patient advised to increase fluids and get plenty of rest.  States he did have a few drinks the night prior and has not been drinking as much, so cannot rule out side effects from alcohol as well.  Patient also advised to find ways to alleviate his stress such as doing things that he enjoys.  Patient advised to follow-up if his symptoms do not improve. ?Final Clinical  Impressions(s) / UC Diagnoses  ? ?Final diagnoses:  ?Acute nonintractable headache, unspecified headache type  ? ? ? ?Discharge Instructions   ? ?  ?Take medication as prescribed. ?Increase fluids.  You sho

## 2021-09-24 NOTE — ED Triage Notes (Signed)
Pt reports facial pain and pressure since yesterday. Pt has tried OTC with out relief. ?

## 2021-09-24 NOTE — Discharge Instructions (Addendum)
Take medication as prescribed. ?Increase fluids.  You should be drinking at least 64 ounces of water daily for adequate hydration. ?Find ways to decrease her stress such as exercise, walking jogging, or finding things that you enjoy. ?Continue the medications that you are currently taking for your allergies. ?Follow-up if symptoms worsen or do not improve. ?Follow-up in the ED if you develop confusion, weakness, inability to walk, slurred speech, double vision, change in vision or other concerns. ?
